# Patient Record
Sex: Female | Born: 1970 | Race: Black or African American | Hispanic: No | Marital: Single | Smoking: Never smoker
Health system: Southern US, Community
[De-identification: ages and names within clinical notes are randomized; demographics above are authoritative.]

---

## 2015-09-01 ENCOUNTER — Emergency Department (HOSPITAL_COMMUNITY): Payer: Self-pay

## 2015-09-01 ENCOUNTER — Emergency Department (HOSPITAL_COMMUNITY)
Admission: EM | Admit: 2015-09-01 | Discharge: 2015-09-01 | Disposition: A | Discharge status: Home / Self Care | Payer: Self-pay | Attending: Emergency Medicine | Admitting: Emergency Medicine

## 2015-09-01 ENCOUNTER — Encounter (HOSPITAL_COMMUNITY): Payer: Self-pay | Admitting: Emergency Medicine

## 2015-09-01 ENCOUNTER — Other Ambulatory Visit: Payer: Self-pay

## 2015-09-01 DIAGNOSIS — I10 Essential (primary) hypertension: Secondary | ICD-10-CM | POA: Insufficient documentation

## 2015-09-01 DIAGNOSIS — Z91040 Latex allergy status: Secondary | ICD-10-CM | POA: Insufficient documentation

## 2015-09-01 DIAGNOSIS — M549 Dorsalgia, unspecified: Secondary | ICD-10-CM

## 2015-09-01 DIAGNOSIS — M545 Low back pain, unspecified: Secondary | ICD-10-CM

## 2015-09-01 LAB — BASIC METABOLIC PANEL
ANION GAP: 9 (ref 5–15)
BUN: 15 mg/dL (ref 6–20)
CO2: 25 mmol/L (ref 22–32)
Calcium: 9.4 mg/dL (ref 8.9–10.3)
Chloride: 107 mmol/L (ref 101–111)
Creatinine, Ser: 0.89 mg/dL (ref 0.44–1.00)
GFR calc Af Amer: >60 mL/min (ref >60)
GLUCOSE: 91 mg/dL (ref 65–99)
POTASSIUM: 4 mmol/L (ref 3.5–5.1)
Sodium: 141 mmol/L (ref 135–145)

## 2015-09-01 LAB — URINE MICROSCOPIC-ADD ON

## 2015-09-01 LAB — CBC
HEMATOCRIT: 33.6 % — AB (ref 36.0–46.0)
HEMOGLOBIN: 10.4 g/dL — AB (ref 12.0–15.0)
MCH: 21.4 pg — AB (ref 26.0–34.0)
MCHC: 31 g/dL (ref 30.0–36.0)
MCV: 69 fL — AB (ref 78.0–100.0)
Platelets: 251 10*3/uL (ref 150–400)
RBC: 4.87 MIL/uL (ref 3.87–5.11)
RDW: 15.4 % (ref 11.5–15.5)
WBC: 7 10*3/uL (ref 4.0–10.5)

## 2015-09-01 LAB — URINALYSIS, ROUTINE W REFLEX MICROSCOPIC
Bilirubin Urine: NEGATIVE
GLUCOSE, UA: NEGATIVE mg/dL
Ketones, ur: NEGATIVE mg/dL
LEUKOCYTES UA: NEGATIVE
Nitrite: NEGATIVE
PH: 7.5 (ref 5.0–8.0)
PROTEIN: NEGATIVE mg/dL
SPECIFIC GRAVITY, URINE: 1.01 (ref 1.005–1.030)

## 2015-09-01 LAB — I-STAT TROPONIN, ED: Troponin i, poc: 0 ng/mL (ref 0.00–0.08)

## 2015-09-01 MED ORDER — ACETAMINOPHEN 500 MG PO TABS
1000.0000 mg | ORAL_TABLET | Freq: Once | ORAL | Status: DC
  Filled 2015-09-01: qty 2

## 2015-09-01 MED ORDER — IBUPROFEN 400 MG PO TABS
400.0000 mg | ORAL_TABLET | Freq: Once | ORAL | Status: DC
  Filled 2015-09-01: qty 1

## 2015-09-01 NOTE — Discharge Instructions (Signed)
Please read and follow all provided instructions.  Your diagnoses today include:  1. Bilateral low back pain without sciatica   2. Back pain   3. Essential hypertension    Tests performed today include:  Vital signs - see below for your results today  Medications prescribed:   Take any prescribed medications only as directed.  Home care instructions:   Follow any educational materials contained in this packet  Please rest, use ice or heat on your back for the next several days  Do not lift, push, pull anything more than 10 pounds for the next week  Follow-up instructions: Please follow-up with your primary care provider in the next 1 week for further evaluation of your symptoms.   Return instructions:  SEEK IMMEDIATE MEDICAL ATTENTION IF YOU HAVE:  New numbness, tingling, weakness, or problem with the use of your arms or legs  Severe back pain not relieved with medications  Loss control of your bowels or bladder  Increasing pain in any areas of the body (such as chest or abdominal pain)  Shortness of breath, dizziness, or fainting.   Worsening nausea (feeling sick to your stomach), vomiting, fever, or sweats  Any other emergent concerns regarding your health   Additional Information:  Your vital signs today were: BP 133/92 mmHg   Pulse 78   Temp(Src) 98 F (36.7 C) (Oral)   Resp 16   Ht 5\' 4"  (1.626 m)   Wt 69.854 kg   BMI 26.42 kg/m2   SpO2 100%   LMP 08/31/2015 If your blood pressure (BP) was elevated above 135/85 this visit, please have this repeated by your doctor within one month. --------------

## 2015-09-01 NOTE — ED Provider Notes (Signed)
CSN: [id number]     Arrival date & time 09/01/15  1527 History   First MD Initiated Contact with Patient 09/01/15 1710     Chief Complaint  Patient presents with  . Chest Pain  . Back Pain   (Consider location/radiation/quality/duration/timing/severity/associated sxs/prior Treatment) HPI 45 y.o. female with a hx of HTN, presents to the Emergency Department today with multiple compltaints: 1) Pt states that she had some mild chest pain this morning after waking up. States that it was central with no radiation. Notes pain lasted <25min and felt sharp. No inciting factors. Pain was 5/10 and then dissipated. No pain currently. No hx ACS. Risk factors: HTN. Has not tried any OTC medication. No N/V. No diaphoresis.  2) Pt also with low back pain. Concern for problems with kidneys. States pain is 2/10 and constant. Worse with movement. No dysuria. No hematuria. No fevers. No loss of bowel or bladder function. No saddle anesthesia. No other symptoms noted.   History reviewed. No pertinent past medical history. History reviewed. No pertinent past surgical history. No family history on file. Social History  Substance Use Topics  . Smoking status: Never Smoker   . Smokeless tobacco: None  . Alcohol Use: No   OB History    No data available     Review of Systems ROS reviewed and all are negative for acute change except as noted in the HPI.  Allergies  Latex  Home Medications   Prior to Admission medications   Not on File   BP 188/107 mmHg  Pulse 109  Temp(Src) 98 F (36.7 C) (Oral)  Resp 15  Ht 5\' 4"  (1.626 m)  Wt 69.854 kg  BMI 26.42 kg/m2  SpO2 100%  LMP 08/31/2015   Physical Exam  Constitutional: She is oriented to person, place, and time. She appears well-developed and well-nourished.  HENT:  Head: Normocephalic and atraumatic.  Eyes: EOM are normal. Pupils are equal, round, and reactive to light.  Neck: Normal range of motion. Neck supple. No tracheal deviation present.   Cardiovascular: Normal rate, regular rhythm, normal heart sounds and intact distal pulses.   No murmur heard. Pulmonary/Chest: Effort normal and breath sounds normal. No respiratory distress. She has no wheezes. She has no rales. She exhibits no tenderness.  Abdominal: Soft. Normal appearance and bowel sounds are normal. There is no tenderness. There is no rigidity, no rebound, no guarding, no tenderness at McBurney's point and negative Murphy's sign.  Musculoskeletal: Normal range of motion.       Cervical back: Normal.       Thoracic back: Normal.       Lumbar back: Normal.  Neurological: She is alert and oriented to person, place, and time.  Skin: Skin is warm and dry.  Psychiatric: She has a normal mood and affect. Her behavior is normal. Thought content normal.  Nursing note and vitals reviewed.  ED Course  Procedures (including critical care time) Labs Review Labs Reviewed  CBC - Abnormal; Notable for the following:    Hemoglobin 10.4 (*)    HCT 33.6 (*)    MCV 69.0 (*)    MCH 21.4 (*)    All other components within normal limits  URINALYSIS, ROUTINE W REFLEX MICROSCOPIC (NOT AT [facility]) - Abnormal; Notable for the following:    APPearance HAZY (*)    Hgb urine dipstick SMALL (*)    All other components within normal limits  URINE MICROSCOPIC-ADD ON - Abnormal; Notable for the following:  Squamous Epithelial / LPF 0-5 (*)    Bacteria, UA FEW (*)    All other components within normal limits  BASIC METABOLIC PANEL  I-STAT TROPOININ, ED   Imaging Review Dg Chest 2 View  09/01/2015  CLINICAL DATA:  Acute onset chest and back pain this morning. EXAM: CHEST  2 VIEW COMPARISON:  None. FINDINGS: The heart size and mediastinal contours are within normal limits. Both lungs are clear. No evidence of pleural effusion or pneumothorax. A wedge-shaped compression deformity of an upper thoracic vertebral body at approximately level of T5 is seen, which is of indeterminate age  radiographically. IMPRESSION: No active cardiopulmonary disease. Upper thoracic vertebral body wedge compression fracture of indeterminate age. Recommend clinical correlation, and consider thoracic spine radiographs or CT for further evaluation. Electronically Signed   By: Myles Rosenthal M.D.   On: 09/01/2015 16:08   Dg Thoracic Spine W/swimmers  09/01/2015  CLINICAL DATA:  45 year old female with back pain. EXAM: THORACIC SPINE - 3 VIEWS COMPARISON:  Earlier Chest radiograph dated 09/01/2015 FINDINGS: There is compression deformity with anterior wedging of the T5 vertebrae which is age indeterminate. Clinical correlation is recommended. CT or MRI may provide better evaluation if clinically indicated. No other fracture identified. There is no subluxation. Soft tissues appear unremarkable. IMPRESSION: Age indeterminate Compression deformity with anterior wedging of T5 vertebra. Clinical correlation is recommended. Electronically Signed   By: Elgie Collard M.D.   On: 09/01/2015 19:05   I have personally reviewed and evaluated these images and lab results as part of my medical decision-making.   EKG Interpretation None      MDM  I have reviewed and evaluated the relevant laboratory values I have reviewed and evaluated the relevant imaging studies.  I have interpreted the relevant EKG. I have reviewed the relevant previous healthcare records.  I obtained HPI from historian. Patient discussed with supervising physician  ED Course:  Assessment: Pt is a 44yF presents with CP this morning. Notes low back pain as well. Risk Factors HTN. Given analgesia in ED. Patient is to be discharged with recommendation to follow up with PCP in regards to today's hospital visit. Chest pain is not likely of cardiac or pulmonary etiology d/t presentation, perc negative, VSS, no tracheal deviation, no JVD or new murmur, RRR, breath sounds equal bilaterally, EKG without acute abnormalities, negative troponin, and negative  CXR, but did show incdental age indeterminate T spine compression fracture. Follow up image showed similar reading with no acute process. No warning symptoms of back pain including: fecal incontinence, urinary retention or overflow incontinence, night sweats, waking from sleep with back pain, unexplained fevers or weight loss, h/o cancer, IVDU, recent trauma. No neurological deficits appreciated. Patient is ambulatory.  No concern for cauda equina, epidural abscess, or other serious cause of back pain. Conservative measures such as rest, ice/heat and pain medicine indicated with PCP follow-up if no improvement with conservative management. Advised to return to the ED is CP becomes exertional, associated with diaphoresis or nausea, radiates to left jaw/arm, worsens or becomes concerning in any way. Pt appears reliable for follow up and is agreeable to discharge. Patient is in no acute distress. Vital Signs are stable. Patient is able to ambulate. Patient able to tolerate PO.   Disposition/Plan:  DC Home Additional Verbal discharge instructions given and discussed with patient.  Pt Instructed to f/u with PCP in the next week for evaluation and treatment of symptoms. Return precautions given Pt acknowledges and agrees with plan  Supervising  PhysicianMancel Baleentz, MD   Final diagnoses:  Bilateral low back pain without sciatica  Essential hypertension    Audry Pili, PA-C 09/01/15 1925  Mancel Bale, MD 09/01/15 2316

## 2015-09-01 NOTE — ED Notes (Signed)
Onset today in AM developed chest pain and lower back pain. Pain currently 0/10 both chest and lower back. States chronic headache currently 3/10 throbbing.

## 2015-09-01 NOTE — ED Notes (Signed)
Pt ambulates independently and with steady gait at time of discharge. Discharge instructions and follow up information reviewed with patient. No other questions or concerns voiced at this time.  

## 2017-01-23 IMAGING — DX DG CHEST 2V
2 series · 2 of 2 positions shown · non-contrast
Comparison: None.

CLINICAL DATA: Acute onset chest and back pain this morning.

EXAM:
CHEST  2 VIEW

[chest pa]
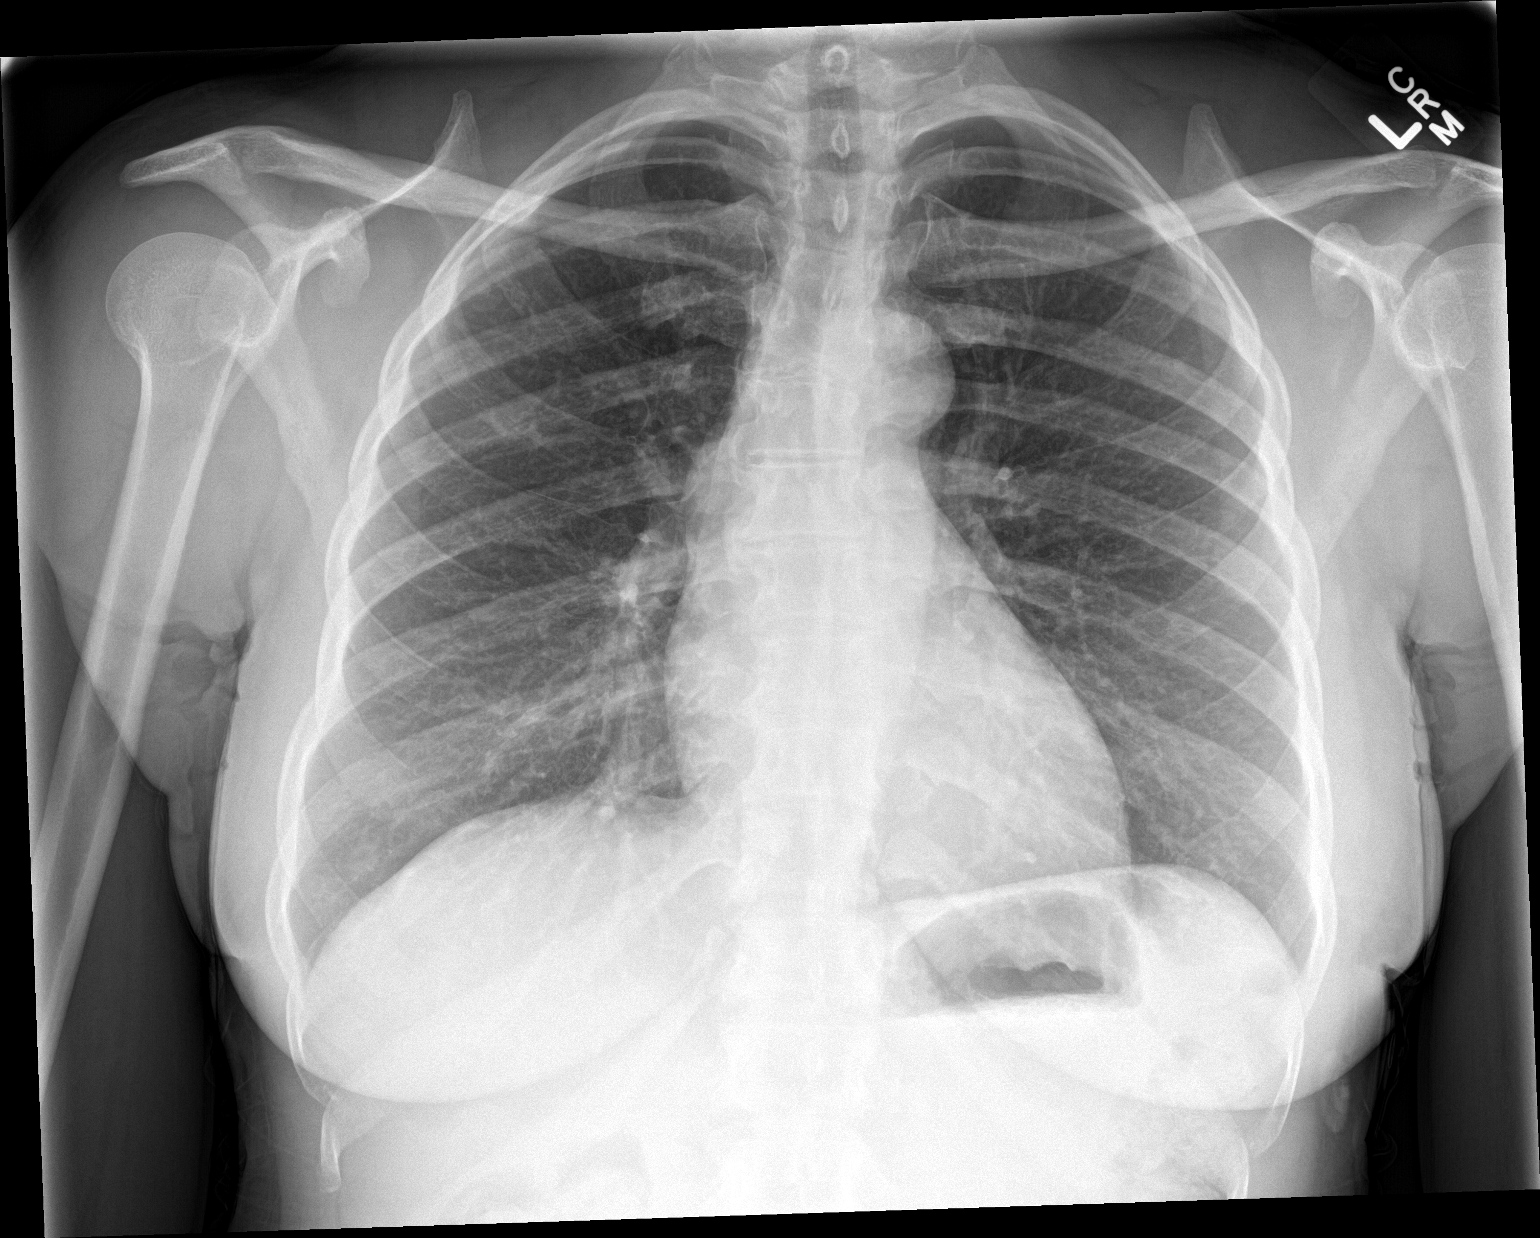

[chest lat]
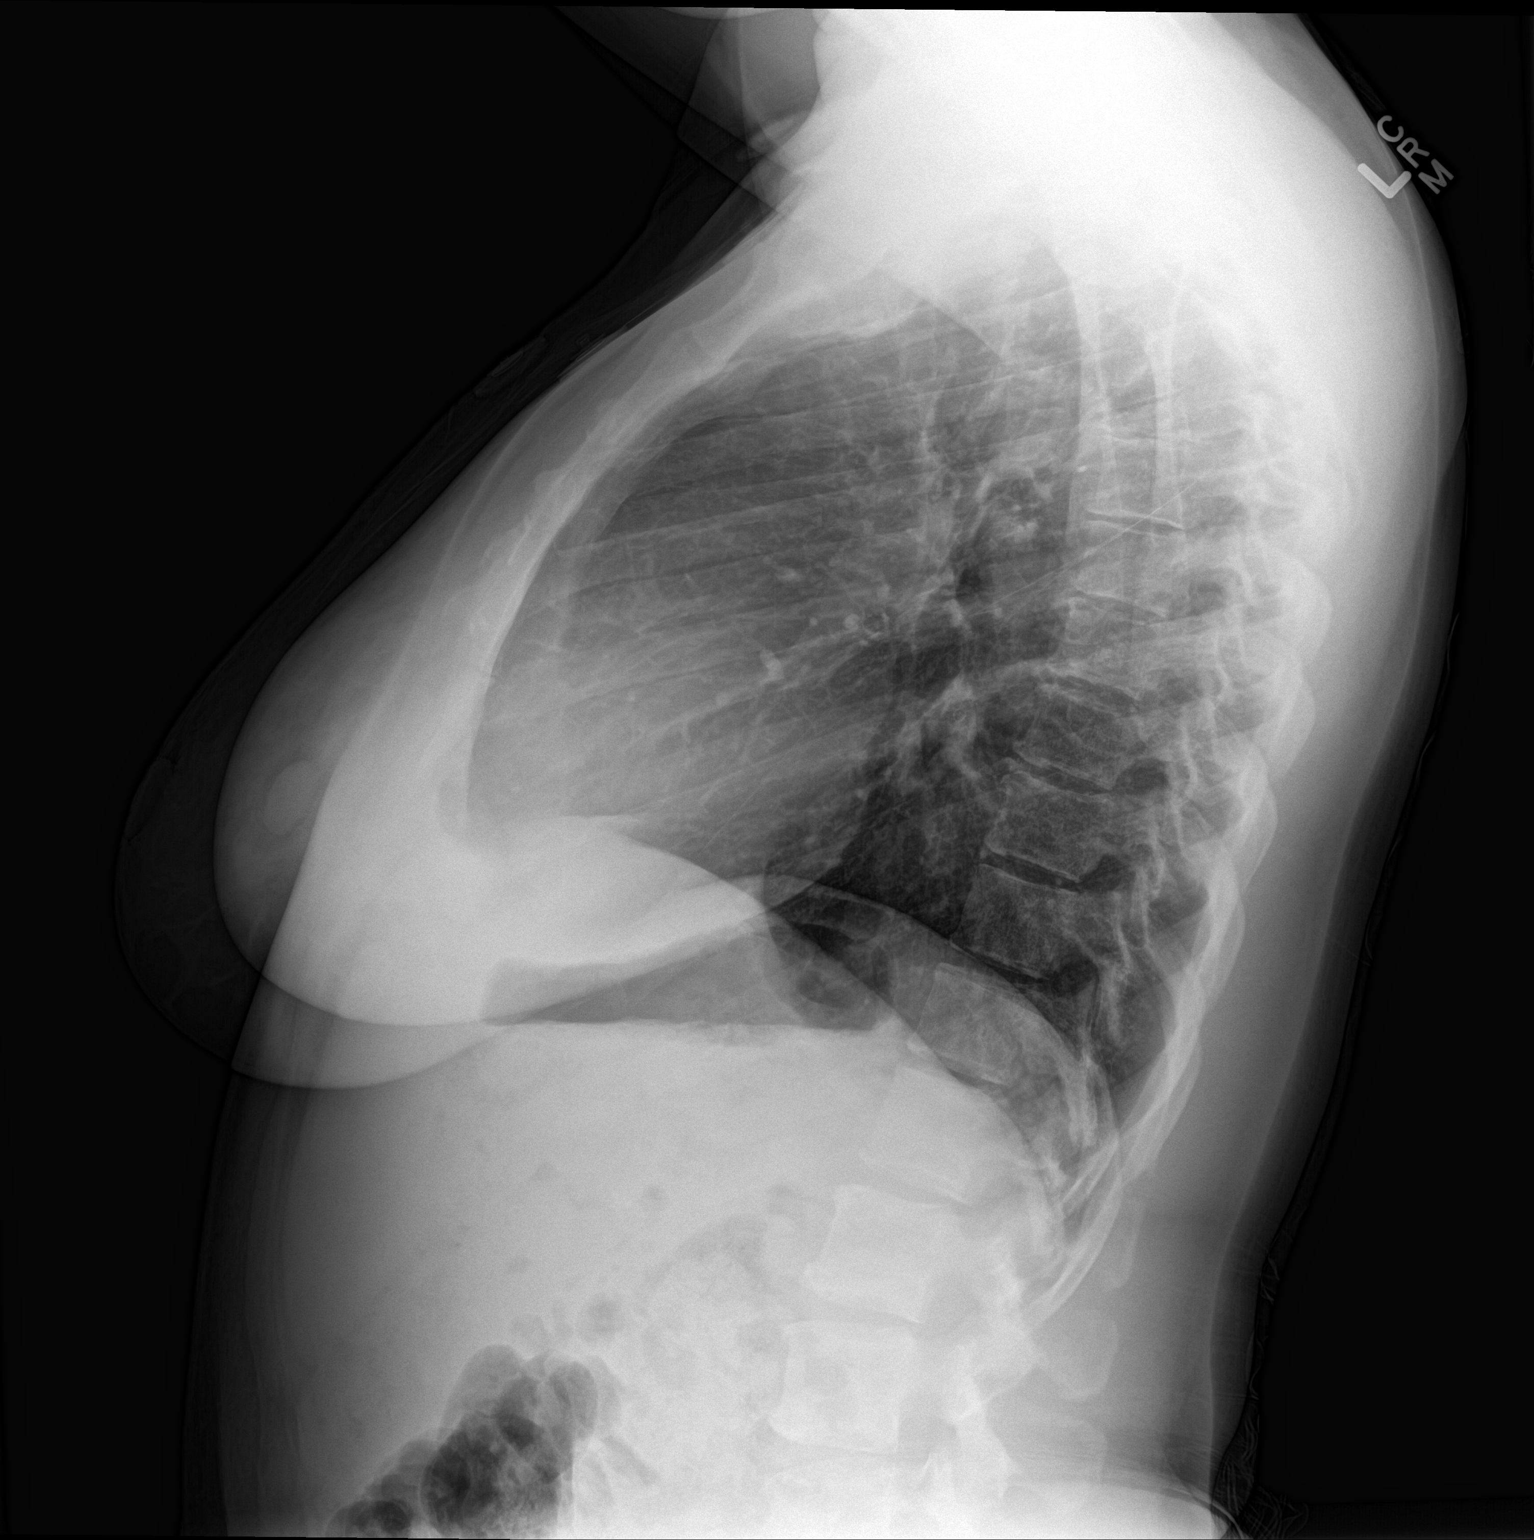

[2 of 2 positions shown; findings below may reference images not displayed]

FINDINGS: The heart size and mediastinal contours are within normal limits.
Both lungs are clear. No evidence of pleural effusion or
pneumothorax.

A wedge-shaped compression deformity of an upper thoracic vertebral
body at approximately level of T5 is seen, which is of indeterminate
age radiographically.
IMPRESSION: No active cardiopulmonary disease.

Upper thoracic vertebral body wedge compression fracture of
indeterminate age. Recommend clinical correlation, and consider
thoracic spine radiographs or CT for further evaluation.

## 2022-11-29 ENCOUNTER — Other Ambulatory Visit (HOSPITAL_COMMUNITY)
Admission: RE | Admit: 2022-11-29 | Discharge: 2022-11-29 | Disposition: A | Discharge status: Home / Self Care | Payer: Self-pay
